# Patient Record
Sex: Female | Born: 1970 | Race: White | Hispanic: No | Marital: Married | Smoking: Current some day smoker
Health system: Southern US, Community
[De-identification: ages and names within clinical notes are randomized; demographics above are authoritative.]

## PROBLEM LIST (undated history)

## (undated) ENCOUNTER — Inpatient Hospital Stay (HOSPITAL_COMMUNITY): Payer: 59

## (undated) DIAGNOSIS — T8859XA Other complications of anesthesia, initial encounter: Secondary | ICD-10-CM

## (undated) DIAGNOSIS — T4145XA Adverse effect of unspecified anesthetic, initial encounter: Secondary | ICD-10-CM

## (undated) DIAGNOSIS — F419 Anxiety disorder, unspecified: Secondary | ICD-10-CM

## (undated) DIAGNOSIS — D649 Anemia, unspecified: Secondary | ICD-10-CM

## (undated) DIAGNOSIS — M1990 Unspecified osteoarthritis, unspecified site: Secondary | ICD-10-CM

## (undated) HISTORY — PX: BREAST BIOPSY: SHX20

---

## 2000-02-13 HISTORY — PX: BACK SURGERY: SHX140

## 2008-02-13 HISTORY — PX: BREAST BIOPSY: SHX20

## 2009-08-22 ENCOUNTER — Ambulatory Visit: Payer: Self-pay | Admitting: General Surgery

## 2011-11-26 ENCOUNTER — Ambulatory Visit: Payer: Self-pay | Admitting: Family Medicine

## 2012-07-28 ENCOUNTER — Emergency Department: Payer: Self-pay | Admitting: Emergency Medicine

## 2012-07-28 LAB — CBC
HGB: 12.4 g/dL (ref 12.0–16.0)
MCH: 30.5 pg (ref 26.0–34.0)
MCHC: 34.2 g/dL (ref 32.0–36.0)
MCV: 89 fL (ref 80–100)
RDW: 13.4 % (ref 11.5–14.5)
WBC: 5.9 10*3/uL (ref 3.6–11.0)

## 2012-07-28 LAB — URINALYSIS, COMPLETE
Bilirubin,UR: NEGATIVE
Glucose,UR: NEGATIVE mg/dL (ref 0–75)
Leukocyte Esterase: NEGATIVE
Nitrite: NEGATIVE
Ph: 6 (ref 4.5–8.0)
Protein: NEGATIVE
RBC,UR: 240 /HPF (ref 0–5)
Squamous Epithelial: <1

## 2013-10-02 ENCOUNTER — Ambulatory Visit: Payer: Self-pay | Admitting: Family Medicine

## 2015-02-13 HISTORY — PX: COLONOSCOPY: SHX174

## 2015-10-20 ENCOUNTER — Other Ambulatory Visit: Payer: Self-pay | Admitting: Obstetrics and Gynecology

## 2015-10-20 DIAGNOSIS — Z1231 Encounter for screening mammogram for malignant neoplasm of breast: Secondary | ICD-10-CM

## 2015-11-11 ENCOUNTER — Ambulatory Visit
Admission: RE | Admit: 2015-11-11 | Discharge: 2015-11-11 | Disposition: A | Discharge status: Home / Self Care | Payer: 59 | Source: Ambulatory Visit | Attending: Obstetrics and Gynecology | Admitting: Obstetrics and Gynecology

## 2015-11-11 ENCOUNTER — Encounter (HOSPITAL_COMMUNITY): Payer: Self-pay

## 2015-11-11 DIAGNOSIS — Z1231 Encounter for screening mammogram for malignant neoplasm of breast: Secondary | ICD-10-CM | POA: Diagnosis present

## 2015-11-23 NOTE — H&P (Signed)
Christina Gibbs is a 45 y.o. female here for Pre Op Consulting (discuss d&c) .  AUB:  - Heavy menstrual bleeding, with large clots, and bleeding mid-cycle  Workup has included: TVUS 10/31/15 Ut wnl Endometrium- possible polyp=0.98 cm  Lt ov wnl Rt simple ov cyst=2.10 cm  Free fluid pcds  Pt is moving to [place] soon and would like to skip the SIS, EMBx and move straight to [facility].  Has a hx of back surgery and had problems with anesthesia - trouble going to sleep with anesthesia. Is very anxious prior to this surgery.  Allergies to morphine +anxiety- will give BZD for day of procedure.  Past Medical History:  has a past medical history of Anemia; Anxiety; Chicken pox; Lumbar disc herniation; Measles; and Stomach ulcer.  Past Surgical History:  has a past surgical history that includes Lumbar discectomy and right breast benign lesion (2012). Family History: family history includes Breast cancer (age of onset: 100) in her maternal aunt; Breast cancer (age of onset: 47) in her maternal grandmother; Colon cancer (age of onset: 26) in her father and paternal grandfather; Coronary artery disease in her paternal grandmother; Diabetes type II in her maternal aunt, maternal aunt, maternal grandmother, maternal uncle, maternal uncle, and mother; Heart attack in her paternal uncle; Heart disease in her paternal grandmother and paternal uncle; Hypertension in her maternal aunt, maternal grandmother, maternal uncle, and mother; Ovarian cancer (age of onset: 74) in her mother; Skin cancer in her father. Social History:  reports that she has been smoking Cigarettes.  She has been smoking about 0.30 packs per day. She has never used smokeless tobacco. She reports that she drinks alcohol. She reports that she does not use illicit drugs. OB/GYN History:  OB History    Gravida Para Term Preterm AB Living   1 1 1  0 0 1   SAB TAB Ectopic Multiple Live Births   0 0 0 0 1      Allergies: is allergic  to morphine and sulfa (sulfonamide antibiotics). Medications:  Current Outpatient Prescriptions:  .  busPIRone (BUSPAR) 15 MG tablet, Take 1 tablet (15 mg total) by mouth 2 (two) times daily., Disp: 60 tablet, Rfl: 11 .  carisoprodol (SOMA) 350 MG tablet, Take 350 mg by mouth., Disp: , Rfl:  .  clonazePAM (KLONOPIN) 0.5 MG tablet, Take 0.5 mg by mouth., Disp: , Rfl:  .  clonazePAM (KLONOPIN) 1 MG tablet, Take 1 tablet (1 mg total) by mouth 2 (two) times daily as needed for Anxiety., Disp: 2 tablet, Rfl: 0   Review of Systems: No SOB, no palpitations or chest pain, no new lower extremity edema, no nausea or vomiting or bowel or bladder complaints. See HPI for gyn specific ROS.     Exam:      Vitals:   11/14/15 1412  BP: (P) 126/72  Pulse: (P) 87   Body mass index is 23.93 kg/(m^2) (pended).  WDWN white  female in NAD Lungs: CTA  CV : RRR without murmur   Breast: deferred Neck:  no thyromegaly Abdomen: soft , no mass, normal active bowel sounds,  non-tender, no rebound tenderness Skin: No rashes, ulcers or skin lesions noted. No excessive hirsutism or acne noted.  Neurological: Appears alert and oriented and is a good historian. No gross abnormalities are noted. Psychological: Normal affect and mood. No signs of anxiety or depression noted.   Pelvic:  Deferred    Impression:   The primary encounter diagnosis was Abnormal uterine bleeding (AUB),  unspecified. A diagnosis of Anticipatory anxiety was also pertinent to this visit.    Plan:   - -  Preoperative visit: D&C hysteroscopy, polypectomy with Myosure device. Consents signed today. Risks of surgery were discussed with the patient including but not limited to: bleeding which may require transfusion; infection which may require antibiotics; injury to uterus or surrounding organs; intrauterine scarring which may impair future fertility; need for additional procedures including laparotomy or laparoscopy;  and other postoperative/anesthesia complications. Written informed consent was obtained.  This is a scheduled same-day surgery. She will have a postop visit in 2 weeks to review operative findings and pathology.  Anxiety: BZD prior to surgery

## 2015-11-28 ENCOUNTER — Encounter
Admission: RE | Admit: 2015-11-28 | Discharge: 2015-11-28 | Disposition: A | Discharge status: Home / Self Care | Payer: 59 | Source: Ambulatory Visit | Attending: Obstetrics and Gynecology | Admitting: Obstetrics and Gynecology

## 2015-11-28 HISTORY — DX: Adverse effect of unspecified anesthetic, initial encounter: T41.45XA

## 2015-11-28 HISTORY — DX: Anemia, unspecified: D64.9

## 2015-11-28 HISTORY — DX: Other complications of anesthesia, initial encounter: T88.59XA

## 2015-11-28 NOTE — Patient Instructions (Signed)
  Your procedure is scheduled on: 12-02-15 (FRIDAY) Report to Same Day Surgery 2nd floor medical mall To find out your arrival time please call [phone] between 1PM - 3PM on 12-01-15 [facility](THURSDAY)  Remember: Instructions that are not followed completely may result in serious medical risk, up to and including death, or upon the discretion of your surgeon and anesthesiologist your surgery may need to be rescheduled.    _x___ 1. Do not eat food or drink liquids after midnight. No gum chewing or hard candies.     __x__ 2. No Alcohol for 24 hours before or after surgery.   __x__3. No Smoking for 24 prior to surgery.   ____  4. Bring all medications with you on the day of surgery if instructed.    __x__ 5. Notify your doctor if there is any change in your medical condition     (cold, fever, infections).     Do not wear jewelry, make-up, hairpins, clips or nail polish.  Do not wear lotions, powders, or perfumes. You may wear deodorant.  Do not shave 48 hours prior to surgery. Men may shave face and neck.  Do not bring valuables to the hospital.    [facility] any belongings or valuables.               Contacts, dentures or bridgework may not be worn into surgery.  Leave your suitcase in the car. After surgery it may be brought to your room.  For patients admitted to the hospital, discharge time is determined by your treatment team.   Patients discharged the day of surgery will not be allowed to drive home.    Please read over the following fact sheets that you were given:   [facility] Pre[facility] for Surgery and or MRSA Information   _x___ Take these medicines the morning of surgery with A SIP OF WATER:    1. KLONOPIN (CLONAZEPAM)  2. BUSPAR  3.  4.  5.  6.  ____Fleets enema or Magnesium Citrate as directed.   ____ Use CHG Soap or sage wipes as directed on instruction sheet   ____ Use inhalers on the day of surgery and bring to hospital day of  surgery  ____ Stop metformin 2 days prior to surgery    ____ Take 1/2 of usual insulin dose the night before surgery and none on the morning of surgery.   ____ Stop aspirin or coumadin, or plavix  x__ Stop Anti-inflammatories such as Advil, Aleve, Ibuprofen, Motrin, Naproxen,          Naprosyn, Goodies powders or aspirin products. Ok to take Tylenol.   _X___ Stop supplements until after surgery-STOP PROBIOTIC AND CRANBERRY-VIT C NOW  ____ Bring C-Pap to the hospital.

## 2015-11-30 ENCOUNTER — Encounter
Admission: RE | Admit: 2015-11-30 | Discharge: 2015-11-30 | Disposition: A | Discharge status: Home / Self Care | Payer: 59 | Source: Ambulatory Visit | Attending: Obstetrics and Gynecology | Admitting: Obstetrics and Gynecology

## 2015-11-30 DIAGNOSIS — F419 Anxiety disorder, unspecified: Secondary | ICD-10-CM | POA: Diagnosis not present

## 2015-11-30 DIAGNOSIS — F17210 Nicotine dependence, cigarettes, uncomplicated: Secondary | ICD-10-CM | POA: Diagnosis not present

## 2015-11-30 DIAGNOSIS — N939 Abnormal uterine and vaginal bleeding, unspecified: Secondary | ICD-10-CM | POA: Diagnosis present

## 2015-11-30 DIAGNOSIS — Z8041 Family history of malignant neoplasm of ovary: Secondary | ICD-10-CM | POA: Diagnosis not present

## 2015-11-30 DIAGNOSIS — Z885 Allergy status to narcotic agent status: Secondary | ICD-10-CM | POA: Diagnosis not present

## 2015-11-30 DIAGNOSIS — Z79899 Other long term (current) drug therapy: Secondary | ICD-10-CM | POA: Diagnosis not present

## 2015-11-30 DIAGNOSIS — N840 Polyp of corpus uteri: Secondary | ICD-10-CM | POA: Diagnosis not present

## 2015-11-30 LAB — BASIC METABOLIC PANEL
Anion gap: 7 (ref 5–15)
BUN: 18 mg/dL (ref 6–20)
CALCIUM: 8.9 mg/dL (ref 8.9–10.3)
CO2: 23 mmol/L (ref 22–32)
CREATININE: 0.78 mg/dL (ref 0.44–1.00)
Chloride: 106 mmol/L (ref 101–111)
GFR calc non Af Amer: >60 mL/min (ref >60)
Glucose, Bld: 91 mg/dL (ref 65–99)
Potassium: 3.4 mmol/L — ABNORMAL LOW (ref 3.5–5.1)
Sodium: 136 mmol/L (ref 135–145)

## 2015-11-30 LAB — CBC
HCT: 39.6 % (ref 35.0–47.0)
Hemoglobin: 13.2 g/dL (ref 12.0–16.0)
MCH: 29 pg (ref 26.0–34.0)
MCHC: 33.3 g/dL (ref 32.0–36.0)
MCV: 87 fL (ref 80.0–100.0)
PLATELETS: 229 10*3/uL (ref 150–440)
RBC: 4.55 MIL/uL (ref 3.80–5.20)
RDW: 14.1 % (ref 11.5–14.5)
WBC: 5.2 10*3/uL (ref 3.6–11.0)

## 2015-11-30 LAB — TYPE AND SCREEN
ABO/RH(D): A POS
Antibody Screen: NEGATIVE

## 2015-12-02 ENCOUNTER — Ambulatory Visit: Payer: 59 | Admitting: Anesthesiology

## 2015-12-02 ENCOUNTER — Ambulatory Visit
Admission: RE | Admit: 2015-12-02 | Discharge: 2015-12-02 | Disposition: A | Discharge status: Home / Self Care | Payer: 59 | Source: Ambulatory Visit | Attending: Obstetrics and Gynecology | Admitting: Obstetrics and Gynecology

## 2015-12-02 ENCOUNTER — Encounter
Admission: RE | Disposition: A | Discharge status: Home / Self Care | Payer: Self-pay | Source: Ambulatory Visit | Attending: Obstetrics and Gynecology

## 2015-12-02 DIAGNOSIS — Z79899 Other long term (current) drug therapy: Secondary | ICD-10-CM | POA: Insufficient documentation

## 2015-12-02 DIAGNOSIS — N939 Abnormal uterine and vaginal bleeding, unspecified: Secondary | ICD-10-CM | POA: Insufficient documentation

## 2015-12-02 DIAGNOSIS — Z885 Allergy status to narcotic agent status: Secondary | ICD-10-CM | POA: Insufficient documentation

## 2015-12-02 DIAGNOSIS — N840 Polyp of corpus uteri: Secondary | ICD-10-CM | POA: Diagnosis not present

## 2015-12-02 DIAGNOSIS — F17210 Nicotine dependence, cigarettes, uncomplicated: Secondary | ICD-10-CM | POA: Insufficient documentation

## 2015-12-02 DIAGNOSIS — F419 Anxiety disorder, unspecified: Secondary | ICD-10-CM | POA: Insufficient documentation

## 2015-12-02 DIAGNOSIS — Z8041 Family history of malignant neoplasm of ovary: Secondary | ICD-10-CM | POA: Insufficient documentation

## 2015-12-02 HISTORY — PX: DILATATION & CURETTAGE/HYSTEROSCOPY WITH MYOSURE: SHX6511

## 2015-12-02 LAB — POCT PREGNANCY, URINE: Preg Test, Ur: NEGATIVE

## 2015-12-02 SURGERY — DILATATION & CURETTAGE/HYSTEROSCOPY WITH MYOSURE
Anesthesia: General

## 2015-12-02 MED ORDER — FAMOTIDINE 20 MG PO TABS
20.0000 mg | ORAL_TABLET | Freq: Once | ORAL | Status: AC
  Administered 2015-12-02: 20 mg via ORAL

## 2015-12-02 MED ORDER — LIDOCAINE HCL (CARDIAC) 20 MG/ML IV SOLN
INTRAVENOUS | Status: DC | PRN
  Administered 2015-12-02: 60 mg via INTRAVENOUS

## 2015-12-02 MED ORDER — DOCUSATE SODIUM 100 MG PO CAPS: 100.0000 mg | ORAL_CAPSULE | Freq: Every day | ORAL | 3 refills | Status: DC | PRN

## 2015-12-02 MED ORDER — FAMOTIDINE 20 MG PO TABS
ORAL_TABLET | ORAL | Status: AC
  Administered 2015-12-02: 20 mg via ORAL
  Filled 2015-12-02: qty 1

## 2015-12-02 MED ORDER — PROPOFOL 10 MG/ML IV BOLUS
INTRAVENOUS | Status: DC | PRN
  Administered 2015-12-02: 120 mg via INTRAVENOUS

## 2015-12-02 MED ORDER — FENTANYL CITRATE (PF) 100 MCG/2ML IJ SOLN: 25.0000 ug | INTRAMUSCULAR | Status: DC | PRN

## 2015-12-02 MED ORDER — ONDANSETRON 4 MG PO TBDP: 4.0000 mg | ORAL_TABLET | Freq: Three times a day (TID) | ORAL | 0 refills | Status: DC | PRN

## 2015-12-02 MED ORDER — KETOROLAC TROMETHAMINE 30 MG/ML IJ SOLN
INTRAMUSCULAR | Status: DC | PRN
  Administered 2015-12-02: 15 mg via INTRAVENOUS

## 2015-12-02 MED ORDER — IBUPROFEN 800 MG PO TABS: 800.0000 mg | ORAL_TABLET | Freq: Three times a day (TID) | ORAL | 0 refills | Status: AC | PRN

## 2015-12-02 MED ORDER — ONDANSETRON HCL 4 MG/2ML IJ SOLN: 4.0000 mg | Freq: Once | INTRAMUSCULAR | Status: DC | PRN

## 2015-12-02 MED ORDER — DEXAMETHASONE SODIUM PHOSPHATE 10 MG/ML IJ SOLN
INTRAMUSCULAR | Status: DC | PRN
  Administered 2015-12-02: 4 mg via INTRAVENOUS

## 2015-12-02 MED ORDER — LACTATED RINGERS IV SOLN
INTRAVENOUS | Status: DC
  Administered 2015-12-02: 09:00:00 via INTRAVENOUS

## 2015-12-02 MED ORDER — ONDANSETRON HCL 4 MG/2ML IJ SOLN
INTRAMUSCULAR | Status: DC | PRN
  Administered 2015-12-02: 4 mg via INTRAVENOUS

## 2015-12-02 MED ORDER — MIDAZOLAM HCL 5 MG/5ML IJ SOLN
INTRAMUSCULAR | Status: DC | PRN
  Administered 2015-12-02: 2 mg via INTRAVENOUS

## 2015-12-02 MED ORDER — FENTANYL CITRATE (PF) 100 MCG/2ML IJ SOLN
INTRAMUSCULAR | Status: DC | PRN
  Administered 2015-12-02 (×4): 25 ug via INTRAVENOUS

## 2015-12-02 MED ORDER — LACTATED RINGERS IV SOLN
INTRAVENOUS | Status: DC
  Administered 2015-12-02: 12:00:00 via INTRAVENOUS

## 2015-12-02 SURGICAL SUPPLY — 18 items
CANISTER SUC SOCK COL 7IN (MISCELLANEOUS) ×3 IMPLANT
CANISTER SUCT 3000ML (MISCELLANEOUS) ×3 IMPLANT
CATH ROBINSON RED A/P 16FR (CATHETERS) ×3 IMPLANT
DEVICE MYOSURE LITE (MISCELLANEOUS) IMPLANT
GLOVE BIO SURGEON STRL SZ 6.5 (GLOVE) ×6 IMPLANT
GLOVE BIO SURGEONS STRL SZ 6.5 (GLOVE) ×3
GLOVE INDICATOR 7.0 STRL GRN (GLOVE) ×9 IMPLANT
GOWN STRL REUS W/ TWL LRG LVL3 (GOWN DISPOSABLE) ×2 IMPLANT
GOWN STRL REUS W/TWL LRG LVL3 (GOWN DISPOSABLE) ×4
KIT RM TURNOVER CYSTO AR (KITS) ×3 IMPLANT
PACK DNC HYST (MISCELLANEOUS) ×3 IMPLANT
PAD OB MATERNITY 4.3X12.25 (PERSONAL CARE ITEMS) ×3 IMPLANT
PAD PREP 24X41 OB/GYN DISP (PERSONAL CARE ITEMS) ×3 IMPLANT
SOL .9 NS 3000ML IRR  AL (IV SOLUTION) ×2
SOL .9 NS 3000ML IRR UROMATIC (IV SOLUTION) ×1 IMPLANT
TUBING CONNECTING 10 (TUBING) ×2 IMPLANT
TUBING CONNECTING 10' (TUBING) ×1
TUBING HYSTEROSCOPY DOLPHIN (MISCELLANEOUS) IMPLANT

## 2015-12-02 NOTE — Anesthesia Procedure Notes (Signed)
Procedure Name: LMA Insertion Date/Time: 12/02/2015 12:15 PM Performed by: Lynnae JanuaryEASTERLING, Braiden Presutti LYNNE Pre-anesthesia Checklist: Patient identified, Emergency Drugs available, Suction available, Patient being monitored and Timeout performed Patient Re-evaluated:Patient Re-evaluated prior to inductionOxygen Delivery Method: Circle system utilized Preoxygenation: Pre-oxygenation with 100% oxygen Intubation Type: IV induction Ventilation: Mask ventilation without difficulty LMA: LMA inserted LMA Size: 4.0 Number of attempts: 1 Dental Injury: Teeth and Oropharynx as per pre-operative assessment

## 2015-12-02 NOTE — Transfer of Care (Signed)
Immediate Anesthesia Transfer of Care Note  Patient: Christina SpragueMeredith L Gibbs  Procedure(s) Performed: Procedure(s): DILATATION & CURETTAGE/HYSTEROSCOPY WITH MYOSURE (N/A)  Patient Location: PACU  Anesthesia Type:General  Level of Consciousness: sedated  Airway & Oxygen Therapy: Patient Spontanous Breathing and Patient connected to nasal cannula oxygen  Post-op Assessment: Report given to RN, Post -op Vital signs reviewed and stable and Patient moving all extremities X 4  Post vital signs: Reviewed and stable  Last Vitals:  Vitals:   12/02/15 0833 12/02/15 1300  BP: 121/88 (P) 104/69  Pulse: 82   Resp: 14   Temp: 36.9 C (P) 36.6 C    Last Pain:  Vitals:   12/02/15 0833  TempSrc: Tympanic         Complications: No apparent anesthesia complications

## 2015-12-02 NOTE — Anesthesia Postprocedure Evaluation (Signed)
Anesthesia Post Note  Patient: Christina SpragueMeredith L Gibbs  Procedure(s) Performed: Procedure(s) (LRB): DILATATION & CURETTAGE/HYSTEROSCOPY WITH MYOSURE (N/A)  Patient location during evaluation: PACU Anesthesia Type: General Level of consciousness: awake and alert Pain management: pain level controlled Vital Signs Assessment: post-procedure vital signs reviewed and stable Respiratory status: spontaneous breathing, nonlabored ventilation, respiratory function stable and patient connected to nasal cannula oxygen Cardiovascular status: blood pressure returned to baseline and stable Postop Assessment: no signs of nausea or vomiting Anesthetic complications: no    Last Vitals:  Vitals:   12/02/15 1345 12/02/15 1401  BP: 113/79 116/61  Pulse: 78 65  Resp:  14  Temp: 36.7 C 36.8 C    Last Pain:  Vitals:   12/02/15 1401  TempSrc: Oral  PainSc: 1                  Melayah Skorupski S

## 2015-12-02 NOTE — Discharge Instructions (Signed)

## 2015-12-02 NOTE — Anesthesia Preprocedure Evaluation (Addendum)
Anesthesia Evaluation  Patient identified by MRN, date of birth, ID band Patient awake    Reviewed: Allergy & Precautions, NPO status , Patient's Chart, lab work & pertinent test results, reviewed documented beta blocker date and time   Airway Mallampati: II  TM Distance: >3 FB     Dental  (+) Chipped   Pulmonary Current Smoker,           Cardiovascular      Neuro/Psych    GI/Hepatic   Endo/Other    Renal/GU      Musculoskeletal   Abdominal   Peds  Hematology  (+) anemia ,   Anesthesia Other Findings Overbite. Can take antibiotics other than morphine as far as she knows. During her lumbar Laminectomy she had a lot of shaking.  Reproductive/Obstetrics                            Anesthesia Physical Anesthesia Plan  ASA: II  Anesthesia Plan: General   Post-op Pain Management:    Induction: Intravenous  Airway Management Planned: LMA  Additional Equipment:   Intra-op Plan:   Post-operative Plan:   Informed Consent: I have reviewed the patients History and Physical, chart, labs and discussed the procedure including the risks, benefits and alternatives for the proposed anesthesia with the patient or authorized representative who has indicated his/her understanding and acceptance.     Plan Discussed with: CRNA  Anesthesia Plan Comments:         Anesthesia Quick Evaluation

## 2015-12-02 NOTE — Interval H&P Note (Signed)
History and Physical Interval Note:  12/02/2015 11:48 AM  Christina SpragueMeredith L Kirshenbaum  has presented today for surgery, with the diagnosis of AUB  The various methods of treatment have been discussed with the patient and family. After consideration of risks, benefits and other options for treatment, the patient has consented to  Procedure(s): DILATATION & CURETTAGE/HYSTEROSCOPY WITH MYOSURE (N/A) as a surgical intervention .  [name] history has been reviewed, patient examined, no change in status, stable for surgery.  I have reviewed the patient's chart and labs.  Questions were answered to the patient's satisfaction.     Christeen DouglasBEASLEY, Keshana Klemz

## 2015-12-02 NOTE — OR Nursing (Signed)
Instructed on the use of incentive spirometry with return demonstration.

## 2015-12-02 NOTE — Op Note (Signed)
Operative Report Hysteroscopy with Dilation and Curettage   Indications: Abnormal uterine bleeding   Pre-operative Diagnosis: Endometrial polyp   Post-operative Diagnosis: same.  Procedure: 1. Exam under anesthesia 2. Fractional D&C 3. Hysteroscopy 4. Polypectomy  Surgeon: Christeen DouglasBethany Neveen Daponte, MD  Assistant(s):  None  Anesthesia: General endotracheal anesthesia  Anesthesiologist: No responsible provider has been recorded for the case. Anesthesiologist: Berdine AddisonMathai Thomas, MD CRNA: Lynnae JanuaryJennifer Lynne Easterling, CRNA; Lily KocherNicole Peralta, CRNA  Estimated Blood Loss:  20mL         Intraoperative medications: Toradol, IV tylenol         Total IV Fluids: 700ml  Urine Output: 25ml  Total Fluid Deficit:  n/a          Specimens: Endocervical curettings, endometrial curettings, endometrial polyp         Complications:  None; patient tolerated the procedure well.         Disposition: PACU - hemodynamically stable.         Condition: stable  Findings: Uterus measuring 10 cm by sound; normal cervix, vagina, perineum. Anterior endometrial polyp at fundus  Indication for procedure/Consents: 45 y.o. F  here for scheduled surgery for the aforementioned diagnoses.   Risks of surgery were discussed with the patient including but not limited to: bleeding which may require transfusion; infection which may require antibiotics; injury to uterus or surrounding organs; intrauterine scarring which may impair future fertility; need for additional procedures including laparotomy or laparoscopy; and other postoperative/anesthesia complications. Written informed consent was obtained.    Procedure Details:   D&C/Polypectomy The patient was taken to the operating room where anesthesia was administered and was found to be adequate. After a formal and adequate timeout was performed, she was placed in the dorsal lithotomy position and examined with the above findings. She was then prepped and draped in the sterile  manner. Her bladder was catheterized for an estimated amount of clear, yellow urine. A weighed speculum was then placed in the patient's vagina and a single tooth tenaculum was applied to the anterior lip of the cervix.  Her cervix was serially dilated to 15 [place]French using Hanks dilators. An ECC was performed. Her uterus was sounded to 10 cm. The hysteroscope was introduced to reveal the above findings. Endometrial polypectomy was performed with polypectomy forceps.   A sharp curettage was then performed until there was a gritty texture in all four quadrants. The tenaculum was removed from the anterior lip of the cervix and the vaginal speculum was removed after applying silver nitrate for good hemostasis.   The patient tolerated the procedure well and was taken to the recovery area awake and in stable condition. She received iv acetaminophen and Toradol prior to leaving the OR.  The patient will be discharged to home as per PACU criteria. Routine postoperative instructions given. She was prescribed Ibuprofen and Colace. She will follow up in the clinic in two weeks for postoperative evaluation.

## 2015-12-05 ENCOUNTER — Encounter: Payer: Self-pay | Admitting: Obstetrics and Gynecology

## 2015-12-05 LAB — SURGICAL PATHOLOGY

## 2016-10-01 ENCOUNTER — Other Ambulatory Visit: Payer: Self-pay | Admitting: Family Medicine

## 2016-10-01 DIAGNOSIS — Z1231 Encounter for screening mammogram for malignant neoplasm of breast: Secondary | ICD-10-CM

## 2016-11-12 ENCOUNTER — Ambulatory Visit
Admission: RE | Admit: 2016-11-12 | Discharge: 2016-11-12 | Disposition: A | Discharge status: Home / Self Care | Payer: 59 | Source: Ambulatory Visit | Attending: Family Medicine | Admitting: Family Medicine

## 2016-11-12 DIAGNOSIS — Z1231 Encounter for screening mammogram for malignant neoplasm of breast: Secondary | ICD-10-CM | POA: Diagnosis present

## 2017-09-10 ENCOUNTER — Ambulatory Visit: Payer: Self-pay | Admitting: Urology

## 2018-02-20 ENCOUNTER — Other Ambulatory Visit: Payer: Self-pay | Admitting: Family Medicine

## 2018-02-20 DIAGNOSIS — Z1231 Encounter for screening mammogram for malignant neoplasm of breast: Secondary | ICD-10-CM

## 2018-03-05 ENCOUNTER — Ambulatory Visit: Payer: 59

## 2018-06-02 ENCOUNTER — Ambulatory Visit: Admit: 2018-06-02 | Payer: 59 | Admitting: Obstetrics and Gynecology

## 2018-06-02 SURGERY — OOPHORECTOMY, LAPAROSCOPIC
Anesthesia: Choice

## 2018-12-13 ENCOUNTER — Ambulatory Visit: Admit: 2018-12-13 | Payer: 59 | Admitting: Obstetrics and Gynecology

## 2018-12-13 SURGERY — DILATATION AND CURETTAGE /HYSTEROSCOPY
Anesthesia: Choice

## 2019-02-25 NOTE — H&P (Signed)
Christina Gibbs is a 49 y.o. female presenting with Pre Op Consulting  History of Present Illness: Patient is an established patient who presents for a pre-operative visit after rescheduling a Diagnostic Laparoscopy with Left Oophorectomy, D&C Hysteroscopy with Possible Polypectomy. Indications are patient's history of chronic pelvic pain, menometrorrhagia, cyst of left ovary, and endometrial polyp. Of note, patient has strong anxiety controlled on medication.  Workup: Body mass index is 24.6 kg/m.  Pap:  03/05/2018 ASCUS/negHPV 09/2015 neg/neg HPV  EMBx: none  D&C10/2017:  Operative findings:anterior polyp at fundus Pathology:neg for malignancy  TVUS:  07/2016- 9 x 5 x 4 cm uterus w/ 8 mm endometrial stripe with possible polyps. Left ovary with a 1 cm simple cyst. Right ovary with a volume of 12cm3. Hyperechoic area cervix =0.54cm  02/2018- 9.4 x 5 x 5.84 cm uterus w/ 9.61 mm endometrial stripe and possible polyp seen 0.71 x 0.51 x 0.84 cm. Rt ov wnl.  Two Lt ov cysts seen; 1 complex septated=1.99 cm Septation=0.16 cm 2 simple collapsed=1.21 cm  Past Medical History:  has a past medical history of Abnormal mammogram (2012), Anemia, Anxiety, ASCUS of cervix with negative high risk HPV, BRCA negative (2014), Chicken pox, Lumbar disc herniation, Measles, Stomach ulcer, and UTI (urinary tract infection).  Past Surgical History:  has a past surgical history that includes Lumbar discectomy; right breast benign lesion (2012); Colonoscopy (01/03/2016); and egd (01/03/2016). Family History: family history includes Breast cancer (age of onset: 98) in her maternal aunt; Breast cancer (age of onset: 85) in her maternal grandmother; Colon cancer in her father and paternal grandfather; Coronary Artery Disease (Blocked arteries around heart) in her paternal grandmother; Diabetes type II in her maternal aunt, maternal aunt, maternal grandmother, maternal  uncle, maternal uncle, and mother; Heart disease in her paternal grandmother and paternal uncle; High blood pressure (Hypertension) in her maternal aunt, maternal grandmother, maternal uncle, and mother; Myocardial Infarction (Heart attack) in her paternal uncle; Ovarian cancer (age of onset: 53) in her mother; Skin cancer in her father; Stroke in her maternal grandmother. Social History:  reports that she has been smoking cigarettes. She has been smoking about 0.30 packs per day. She has never used smokeless tobacco. She reports current alcohol use. She reports that she does not use drugs. OB/GYN History:          OB History    Gravida  1   Para  1   Term  1   Preterm  0   AB  0   Living  1     SAB  0   TAB  0   Ectopic  0   Molar  0   Multiple  0   Live Births  1        Allergies: is allergic to metronidazole; morphine; and sulfa (sulfonamide antibiotics). Medications:  Current Outpatient Medications:  .  ascorbic acid, vitamin C, (VITAMIN C) 500 MG tablet, Take 500 mg by mouth once daily, Disp: , Rfl:  .  carisoprodol (SOMA) 350 MG tablet, Take 1 tablet (350 mg total) by mouth 3 (three) times daily as needed, Disp: 90 tablet, Rfl: 0 .  citalopram (CELEXA) 40 MG tablet, TAKE 1 TABLET BY MOUTH EVERY DAY, Disp: 90 tablet, Rfl: 3 .  clonazePAM (KLONOPIN) 0.5 MG tablet, Take 1 tablet (0.5 mg total) by mouth once daily as needed, Disp: 30 tablet, Rfl: 3 .  CRANBERRY FRUIT EXTRACT (CRANBERRY ORAL), Take by mouth., Disp: , Rfl:  .  Lactobac 41-B.bifid,lactis-FOS (  ULTIMATE PROBIOTIC-10) 111 mg (25 billion cell) Cap, Take by mouth., Disp: , Rfl:  .  meloxicam (MOBIC) 15 MG tablet, Take 1 tablet (15 mg total) by mouth once daily Needs appt for more refills, Disp: 30 tablet, Rfl: 0   Exam:   BP 112/71   Ht 165.1 cm (5' 5")   Wt 67 kg (147 lb 12.8 oz)   LMP 02/08/2019 (Exact Date)   BMI 24.60 kg/m   Constitutional:  General appearance: Well nourished, well developed  female in no acute distress.  Neuro/psych:  Normal mood and affect. No gross motor deficits. Neck:  Supple, normal appearance.  Respiratory:  Normal respiratory effort, no use of accessory muscles Skin:  No visible rashes or external lesions  Impression:   The primary encounter diagnosis was Pre-op evaluation. Diagnoses of Pelvic pain in female, Excessive or frequent menstruation, Endometrial polyp, and Left ovarian cyst were also pertinent to this visit.  Plan:   1.Pelvic Pain, Left Ovarian Cyst, Menorrhagia, Endometrial Polyp Patient returns for a preoperative discussion regarding her plans to proceed with surgical treatment of herchronic pelvic pain, cyst of left ovary, menorrhagia with irregular cycle, endometrial polypby Diagnostic Laparoscopy with Left Oophorectomy and D&C Hysteroscopy with Polypectomyprocedure.The patient and I discussed the technical aspects of the procedure including the potential for risks and complications.These include but are not limited to the risk of infection requiring post-operative antibiotics or further procedures.We talked about the risk of injury to adjacent organs including bladder, bowel, ureter, blood vessels or nerves.We talked about the need to convert to an open incision.We talked about the possible need for blood transfusion. Possibleintrauterine scarring which may impair future fertility.We talked aboutpostop complications such asthromboembolic or cardiopulmonary complications.All of her questions were answered. Her preoperative exam was completed and the appropriate consents were signed. She is scheduled to undergo this procedure in the near future.   Specific Peri-operative Considerations: - Consent: obtained today - Health Maintenance:none - Labs: CBC, CMP preoperatively - Studies: EKG, CXR preoperatively - Bowel Preparation: None required - XNT:ZGYFV 2g - VTE ppx: SCDs perioperatively - Glucose Protocol:none -  Beta-blockade:none

## 2019-02-27 ENCOUNTER — Encounter
Admission: RE | Admit: 2019-02-27 | Discharge: 2019-02-27 | Disposition: A | Discharge status: Home / Self Care | Payer: 59 | Source: Ambulatory Visit | Attending: Obstetrics and Gynecology | Admitting: Obstetrics and Gynecology

## 2019-02-27 DIAGNOSIS — Z01812 Encounter for preprocedural laboratory examination: Secondary | ICD-10-CM | POA: Diagnosis not present

## 2019-02-27 HISTORY — DX: Anxiety disorder, unspecified: F41.9

## 2019-02-27 HISTORY — DX: Unspecified osteoarthritis, unspecified site: M19.90

## 2019-02-27 NOTE — Patient Instructions (Addendum)
Your procedure is scheduled on: 03-04-19 [facility] Report to Same Day Surgery 2nd floor medical mall [facility] Entrance-take elevator on left to 2nd floor.  Check in with surgery information desk.) To find out your arrival time please call [phone] between 1PM - 3PM on 03-03-19 TUESDAY  Remember: Instructions that are not followed completely may result in serious medical risk, up to and including death, or upon the discretion of your surgeon and anesthesiologist your surgery may need to be rescheduled.    _x___ 1. Do not eat food after midnight the night before your procedure. NO GUM OR CANDY AFTER MIDNIGHT. You may drink clear liquids up to 2 hours before you are scheduled to arrive at the hospital for your procedure.  Do not drink clear liquids within 2 hours of your scheduled arrival to the hospital.  Clear liquids include  --Water or Apple juice without pulp  --Gatorade  --Black Coffee or Clear Tea (No milk, no creamers, do not add anything to the coffee or Tea   ____Ensure clear carbohydrate drink on the way to the hospital for bariatric patients  _X___Ensure clear carbohydrate drink 3 hours PRIOR TO ARRIVAL TIME TO HOSPITAL      __x__ 2. No Alcohol for 24 hours before or after surgery.   __x__3. No Smoking or e-cigarettes for 24 prior to surgery.  Do not use any chewable tobacco products for at least 6 hour prior to surgery   ____  4. Bring all medications with you on the day of surgery if instructed.    __x__ 5. Notify your doctor if there is any change in your medical condition     (cold, fever, infections).    x___6. On the morning of surgery brush your teeth with toothpaste and water.  You may rinse your mouth with mouth wash if you wish.  Do not swallow any toothpaste or mouthwash.   Do not wear jewelry, make-up, hairpins, clips or nail polish.  Do not wear lotions, powders, or perfumes.  Do not shave 48 hours prior to surgery. Men may shave face and neck.  Do not  bring valuables to the hospital.    [facility] is not responsible for any belongings or valuables.               Contacts, dentures or bridgework may not be worn into surgery.  Leave your suitcase in the car. After surgery it may be brought to your room.  For patients admitted to the hospital, discharge time is determined by your treatment team.  _  Patients discharged the day of surgery will not be allowed to drive home.  You will need someone to drive you home and stay with you the night of your procedure.    Please read over the following fact sheets that you were given:   [facility] Preparing for Surgery   _x___ TAKE THE FOLLOWING MEDICATION THE MORNING OF SURGERY WITH A SMALL SIP OF WATER. These include:  1. CELEXA (CITALOPRAM)  2. YOU MAY TAKE YOUR KLONOPIN (CLONAZEPAM) THE MORNING OF SURGERY IF NEEDED  3.  4.  5.  6.  ____Fleets enema or Magnesium Citrate as directed.   _x___ Use CHG Soap or sage wipes as directed on instruction sheet   ____ Use inhalers on the day of surgery and bring to hospital day of surgery  ____ Stop Metformin and Janumet 2 days prior to surgery.    ____ Take 1/2 of usual insulin dose the night before surgery  and none on the morning surgery.   ____ Follow recommendations from Cardiologist, Pulmonologist or PCP regarding stopping Aspirin, Coumadin, Plavix ,Eliquis, Effient, or Pradaxa, and Pletal.  X____Stop Anti-inflammatories such as Advil, Aleve, Ibuprofen, Motrin, Naproxen, Naprosyn, Goodies powders or aspirin products  NOW-OK to take Tylenol   _x___ Stop supplements until after surgery-STOP CRANBERRY NOW-MAY RESUME AFTER SURGERY   ____ Bring C-Pap to the hospital.

## 2019-03-02 ENCOUNTER — Other Ambulatory Visit
Admission: RE | Admit: 2019-03-02 | Discharge: 2019-03-02 | Disposition: A | Discharge status: Home / Self Care | Payer: 59 | Source: Ambulatory Visit | Attending: Obstetrics and Gynecology | Admitting: Obstetrics and Gynecology

## 2019-03-02 DIAGNOSIS — Z01812 Encounter for preprocedural laboratory examination: Secondary | ICD-10-CM | POA: Diagnosis not present

## 2019-03-02 DIAGNOSIS — Z20822 Contact with and (suspected) exposure to covid-19: Secondary | ICD-10-CM | POA: Diagnosis not present

## 2019-03-02 LAB — TYPE AND SCREEN
ABO/RH(D): A POS
Antibody Screen: NEGATIVE

## 2019-03-02 LAB — BASIC METABOLIC PANEL
Anion gap: 9 (ref 5–15)
BUN: 17 mg/dL (ref 6–20)
CO2: 26 mmol/L (ref 22–32)
Calcium: 9 mg/dL (ref 8.9–10.3)
Chloride: 102 mmol/L (ref 98–111)
Creatinine, Ser: 0.69 mg/dL (ref 0.44–1.00)
GFR calc Af Amer: >60 mL/min (ref >60)
GFR calc non Af Amer: >60 mL/min (ref >60)
Glucose, Bld: 97 mg/dL (ref 70–99)
Potassium: 3.9 mmol/L (ref 3.5–5.1)
Sodium: 137 mmol/L (ref 135–145)

## 2019-03-02 LAB — CBC
HCT: 39 % (ref 36.0–46.0)
Hemoglobin: 12.8 g/dL (ref 12.0–15.0)
MCH: 30.9 pg (ref 26.0–34.0)
MCHC: 32.8 g/dL (ref 30.0–36.0)
MCV: 94.2 fL (ref 80.0–100.0)
Platelets: 215 10*3/uL (ref 150–400)
RBC: 4.14 MIL/uL (ref 3.87–5.11)
RDW: 12.4 % (ref 11.5–15.5)
WBC: 6 10*3/uL (ref 4.0–10.5)
nRBC: 0 % (ref 0.0–0.2)

## 2019-03-02 LAB — SARS CORONAVIRUS 2 (TAT 6-24 HRS): SARS Coronavirus 2: NEGATIVE

## 2019-03-04 ENCOUNTER — Encounter: Payer: Self-pay | Admitting: Obstetrics and Gynecology

## 2019-03-04 ENCOUNTER — Other Ambulatory Visit: Payer: Self-pay

## 2019-03-04 ENCOUNTER — Encounter
Admission: RE | Disposition: A | Discharge status: Home / Self Care | Payer: Self-pay | Source: Home / Self Care | Attending: Obstetrics and Gynecology

## 2019-03-04 ENCOUNTER — Ambulatory Visit: Payer: 59 | Admitting: Anesthesiology

## 2019-03-04 ENCOUNTER — Ambulatory Visit
Admission: RE | Admit: 2019-03-04 | Discharge: 2019-03-04 | Disposition: A | Discharge status: Home / Self Care | Payer: 59 | Attending: Obstetrics and Gynecology | Admitting: Obstetrics and Gynecology

## 2019-03-04 DIAGNOSIS — K660 Peritoneal adhesions (postprocedural) (postinfection): Secondary | ICD-10-CM | POA: Diagnosis not present

## 2019-03-04 DIAGNOSIS — G8929 Other chronic pain: Secondary | ICD-10-CM | POA: Insufficient documentation

## 2019-03-04 DIAGNOSIS — N921 Excessive and frequent menstruation with irregular cycle: Secondary | ICD-10-CM | POA: Insufficient documentation

## 2019-03-04 DIAGNOSIS — R102 Pelvic and perineal pain: Secondary | ICD-10-CM | POA: Insufficient documentation

## 2019-03-04 DIAGNOSIS — Z8041 Family history of malignant neoplasm of ovary: Secondary | ICD-10-CM | POA: Diagnosis not present

## 2019-03-04 DIAGNOSIS — N840 Polyp of corpus uteri: Secondary | ICD-10-CM | POA: Insufficient documentation

## 2019-03-04 DIAGNOSIS — Z79899 Other long term (current) drug therapy: Secondary | ICD-10-CM | POA: Insufficient documentation

## 2019-03-04 DIAGNOSIS — N8312 Corpus luteum cyst of left ovary: Secondary | ICD-10-CM | POA: Insufficient documentation

## 2019-03-04 DIAGNOSIS — Z791 Long term (current) use of non-steroidal anti-inflammatories (NSAID): Secondary | ICD-10-CM | POA: Insufficient documentation

## 2019-03-04 DIAGNOSIS — F419 Anxiety disorder, unspecified: Secondary | ICD-10-CM | POA: Insufficient documentation

## 2019-03-04 DIAGNOSIS — N83202 Unspecified ovarian cyst, left side: Secondary | ICD-10-CM | POA: Diagnosis present

## 2019-03-04 DIAGNOSIS — F17210 Nicotine dependence, cigarettes, uncomplicated: Secondary | ICD-10-CM | POA: Diagnosis not present

## 2019-03-04 HISTORY — PX: LAPAROSCOPIC UNILATERAL SALPINGO OOPHERECTOMY: SHX5935

## 2019-03-04 HISTORY — PX: HYSTEROSCOPY WITH D & C: SHX1775

## 2019-03-04 HISTORY — PX: LAPAROSCOPY: SHX197

## 2019-03-04 LAB — POCT PREGNANCY, URINE: Preg Test, Ur: NEGATIVE

## 2019-03-04 SURGERY — DILATATION AND CURETTAGE /HYSTEROSCOPY
Anesthesia: General

## 2019-03-04 MED ORDER — ROCURONIUM BROMIDE 100 MG/10ML IV SOLN
INTRAVENOUS | Status: DC | PRN
  Administered 2019-03-04: 35 mg via INTRAVENOUS
  Administered 2019-03-04: 15 mg via INTRAVENOUS

## 2019-03-04 MED ORDER — MIDAZOLAM HCL 2 MG/2ML IJ SOLN
INTRAMUSCULAR | Status: DC | PRN
  Administered 2019-03-04: 2 mg via INTRAVENOUS

## 2019-03-04 MED ORDER — CEFAZOLIN SODIUM-DEXTROSE 2-3 GM-%(50ML) IV SOLR
INTRAVENOUS | Status: DC | PRN
  Administered 2019-03-04: 2 g via INTRAVENOUS

## 2019-03-04 MED ORDER — PROPOFOL 500 MG/50ML IV EMUL
INTRAVENOUS | Status: AC
  Filled 2019-03-04: qty 50

## 2019-03-04 MED ORDER — ONDANSETRON HCL 4 MG/2ML IJ SOLN
INTRAMUSCULAR | Status: DC | PRN
  Administered 2019-03-04: 4 mg via INTRAVENOUS

## 2019-03-04 MED ORDER — OXYCODONE HCL 5 MG PO CAPS: 5.0000 mg | ORAL_CAPSULE | Freq: Four times a day (QID) | ORAL | 0 refills | Status: AC | PRN

## 2019-03-04 MED ORDER — ACETAMINOPHEN 500 MG PO TABS
ORAL_TABLET | ORAL | Status: AC
  Administered 2019-03-04: 1000 mg via ORAL
  Filled 2019-03-04: qty 2

## 2019-03-04 MED ORDER — IBUPROFEN 800 MG PO TABS: 800.0000 mg | ORAL_TABLET | Freq: Three times a day (TID) | ORAL | 1 refills | Status: AC

## 2019-03-04 MED ORDER — ACETAMINOPHEN 500 MG PO TABS: 1000.0000 mg | ORAL_TABLET | Freq: Four times a day (QID) | ORAL | 0 refills | Status: AC

## 2019-03-04 MED ORDER — SUGAMMADEX SODIUM 500 MG/5ML IV SOLN
INTRAVENOUS | Status: DC | PRN
  Administered 2019-03-04: 200 mg via INTRAVENOUS

## 2019-03-04 MED ORDER — ONDANSETRON 4 MG PO TBDP: 4.0000 mg | ORAL_TABLET | Freq: Four times a day (QID) | ORAL | 0 refills | Status: AC | PRN

## 2019-03-04 MED ORDER — FAMOTIDINE 20 MG PO TABS: 20.0000 mg | ORAL_TABLET | Freq: Once | ORAL | Status: AC

## 2019-03-04 MED ORDER — FENTANYL CITRATE (PF) 100 MCG/2ML IJ SOLN
25.0000 ug | INTRAMUSCULAR | Status: DC | PRN
  Administered 2019-03-04: 25 ug via INTRAVENOUS

## 2019-03-04 MED ORDER — LACTATED RINGERS IV SOLN: INTRAVENOUS | Status: DC

## 2019-03-04 MED ORDER — KETOROLAC TROMETHAMINE 30 MG/ML IJ SOLN
INTRAMUSCULAR | Status: DC | PRN
  Administered 2019-03-04: 30 mg via INTRAVENOUS

## 2019-03-04 MED ORDER — DOCUSATE SODIUM 100 MG PO CAPS: 100.0000 mg | ORAL_CAPSULE | Freq: Two times a day (BID) | ORAL | 0 refills | Status: AC

## 2019-03-04 MED ORDER — LIDOCAINE HCL (CARDIAC) PF 100 MG/5ML IV SOSY
PREFILLED_SYRINGE | INTRAVENOUS | Status: DC | PRN
  Administered 2019-03-04: 100 mg via INTRAVENOUS

## 2019-03-04 MED ORDER — ONDANSETRON HCL 4 MG/2ML IJ SOLN: 4.0000 mg | Freq: Once | INTRAMUSCULAR | Status: DC | PRN

## 2019-03-04 MED ORDER — ACETAMINOPHEN 500 MG PO TABS: 1000.0000 mg | ORAL_TABLET | ORAL | Status: AC

## 2019-03-04 MED ORDER — FENTANYL CITRATE (PF) 100 MCG/2ML IJ SOLN
INTRAMUSCULAR | Status: AC
  Filled 2019-03-04: qty 2

## 2019-03-04 MED ORDER — GABAPENTIN 300 MG PO CAPS: 300.0000 mg | ORAL_CAPSULE | ORAL | Status: AC

## 2019-03-04 MED ORDER — GABAPENTIN 300 MG PO CAPS
ORAL_CAPSULE | ORAL | Status: AC
  Administered 2019-03-04: 300 mg via ORAL
  Filled 2019-03-04: qty 1

## 2019-03-04 MED ORDER — LACTATED RINGERS IV SOLN: INTRAVENOUS | Status: DC | PRN

## 2019-03-04 MED ORDER — FENTANYL CITRATE (PF) 100 MCG/2ML IJ SOLN
INTRAMUSCULAR | Status: AC
  Administered 2019-03-04: 25 ug via INTRAVENOUS
  Filled 2019-03-04: qty 2

## 2019-03-04 MED ORDER — FAMOTIDINE 20 MG PO TABS
ORAL_TABLET | ORAL | Status: AC
  Administered 2019-03-04: 20 mg via ORAL
  Filled 2019-03-04: qty 1

## 2019-03-04 MED ORDER — PROPOFOL 10 MG/ML IV BOLUS
INTRAVENOUS | Status: DC | PRN
  Administered 2019-03-04: 150 mg via INTRAVENOUS

## 2019-03-04 MED ORDER — GABAPENTIN 800 MG PO TABS: 800.0000 mg | ORAL_TABLET | Freq: Every day | ORAL | 0 refills | Status: AC

## 2019-03-04 MED ORDER — BUPIVACAINE HCL (PF) 0.5 % IJ SOLN
INTRAMUSCULAR | Status: AC
  Filled 2019-03-04: qty 30

## 2019-03-04 MED ORDER — DEXAMETHASONE SODIUM PHOSPHATE 10 MG/ML IJ SOLN
INTRAMUSCULAR | Status: DC | PRN
  Administered 2019-03-04: 10 mg via INTRAVENOUS

## 2019-03-04 MED ORDER — BUPIVACAINE HCL 0.5 % IJ SOLN
INTRAMUSCULAR | Status: DC | PRN
  Administered 2019-03-04: 10 mL

## 2019-03-04 MED ORDER — FENTANYL CITRATE (PF) 100 MCG/2ML IJ SOLN
INTRAMUSCULAR | Status: DC | PRN
  Administered 2019-03-04: 25 ug via INTRAVENOUS
  Administered 2019-03-04: 50 ug via INTRAVENOUS
  Administered 2019-03-04: 25 ug via INTRAVENOUS

## 2019-03-04 MED ORDER — MIDAZOLAM HCL 2 MG/2ML IJ SOLN
INTRAMUSCULAR | Status: AC
  Filled 2019-03-04: qty 2

## 2019-03-04 SURGICAL SUPPLY — 62 items
ANCHOR TIS RET SYS 235ML (MISCELLANEOUS) ×2 IMPLANT
BAG INFUSER PRESSURE 100CC (MISCELLANEOUS) ×2 IMPLANT
BAG URINE DRAIN 2000ML AR STRL (UROLOGICAL SUPPLIES) ×4 IMPLANT
BLADE SURG SZ11 CARB STEEL (BLADE) ×4 IMPLANT
CANISTER SUCT 3000ML PPV (MISCELLANEOUS) ×4 IMPLANT
CATH FOLEY 2WAY  5CC 16FR (CATHETERS) ×2
CATH ROBINSON RED A/P 16FR (CATHETERS) ×4 IMPLANT
CATH URTH 16FR FL 2W BLN LF (CATHETERS) ×2 IMPLANT
CHLORAPREP W/TINT 26 (MISCELLANEOUS) ×4 IMPLANT
CLOSURE WOUND 1/4X4 (GAUZE/BANDAGES/DRESSINGS) ×1
CORD MONOPOLAR M/FML 12FT (MISCELLANEOUS) ×2 IMPLANT
COVER LIGHT HANDLE STERIS (MISCELLANEOUS) ×2 IMPLANT
COVER WAND RF STERILE (DRAPES) ×4 IMPLANT
DERMABOND ADVANCED (GAUZE/BANDAGES/DRESSINGS) ×2
DERMABOND ADVANCED .7 DNX12 (GAUZE/BANDAGES/DRESSINGS) ×2 IMPLANT
DRAPE GENERAL ENDO 106X123.5 (DRAPES) ×4 IMPLANT
DRAPE LEGGINS SURG 28X43 STRL (DRAPES) ×4 IMPLANT
DRAPE STERI POUCH LG 24X46 STR (DRAPES) IMPLANT
DRAPE UNDER BUTTOCK W/FLU (DRAPES) ×4 IMPLANT
ELECT REM PT RETURN 9FT ADLT (ELECTROSURGICAL) ×4
ELECTRODE REM PT RTRN 9FT ADLT (ELECTROSURGICAL) ×2 IMPLANT
GLOVE BIO SURGEON STRL SZ7 (GLOVE) ×12 IMPLANT
GLOVE INDICATOR 7.5 STRL GRN (GLOVE) ×18 IMPLANT
GOWN STRL REUS W/ TWL LRG LVL3 (GOWN DISPOSABLE) ×4 IMPLANT
GOWN STRL REUS W/TWL LRG LVL3 (GOWN DISPOSABLE) ×4
IRRIGATION STRYKERFLOW (MISCELLANEOUS) ×2 IMPLANT
IRRIGATOR STRYKERFLOW (MISCELLANEOUS)
IV NS 1000ML (IV SOLUTION) ×2
IV NS 1000ML BAXH (IV SOLUTION) ×2 IMPLANT
KIT PINK PAD W/HEAD ARE REST (MISCELLANEOUS) ×4
KIT PINK PAD W/HEAD ARM REST (MISCELLANEOUS) ×2 IMPLANT
KIT PROCEDURE FLUENT (KITS) ×4 IMPLANT
KIT TURNOVER CYSTO (KITS) ×4 IMPLANT
LABEL OR SOLS (LABEL) ×4 IMPLANT
LIGASURE VESSEL 5MM BLUNT TIP (ELECTROSURGICAL) ×2 IMPLANT
NDL FILTER BLUNT 18X1 1/2 (NEEDLE) ×2 IMPLANT
NEEDLE FILTER BLUNT 18X 1/2SAF (NEEDLE) ×2
NEEDLE FILTER BLUNT 18X1 1/2 (NEEDLE) ×2 IMPLANT
NS IRRIG 500ML POUR BTL (IV SOLUTION) ×4 IMPLANT
PACK DNC HYST (MISCELLANEOUS) ×4 IMPLANT
PACK GYN LAPAROSCOPIC (MISCELLANEOUS) ×4 IMPLANT
PAD OB MATERNITY 4.3X12.25 (PERSONAL CARE ITEMS) ×4 IMPLANT
PAD PREP 24X41 OB/GYN DISP (PERSONAL CARE ITEMS) ×4 IMPLANT
POUCH SPECIMEN RETRIEVAL 10MM (ENDOMECHANICALS) IMPLANT
SCISSORS METZENBAUM CVD 33 (INSTRUMENTS) ×2 IMPLANT
SEAL ROD LENS SCOPE MYOSURE (ABLATOR) ×2 IMPLANT
SET TUBE SMOKE EVAC HIGH FLOW (TUBING) ×4 IMPLANT
SLEEVE ENDOPATH XCEL 5M (ENDOMECHANICALS) ×6 IMPLANT
SOL .9 NS 3000ML IRR  AL (IV SOLUTION) ×2
SOL .9 NS 3000ML IRR UROMATIC (IV SOLUTION) ×2 IMPLANT
STRIP CLOSURE SKIN 1/4X4 (GAUZE/BANDAGES/DRESSINGS) ×3 IMPLANT
SUT MNCRL AB 4-0 PS2 18 (SUTURE) ×4 IMPLANT
SUT VIC AB 0 CT1 36 (SUTURE) ×2 IMPLANT
SUT VIC AB 2-0 UR6 27 (SUTURE) ×2 IMPLANT
SUT VIC AB 4-0 SH 27 (SUTURE)
SUT VIC AB 4-0 SH 27XANBCTRL (SUTURE) ×2 IMPLANT
SYR 50ML LL SCALE MARK (SYRINGE) IMPLANT
SYR 5ML LL (SYRINGE) ×4 IMPLANT
TROCAR XCEL NON-BLD 5MMX100MML (ENDOMECHANICALS) ×4 IMPLANT
TUBING ART PRESS 48 MALE/FEM (TUBING) IMPLANT
TUBING CONNECTING 10 (TUBING) ×3 IMPLANT
TUBING CONNECTING 10' (TUBING) ×1

## 2019-03-04 NOTE — Anesthesia Preprocedure Evaluation (Signed)
Anesthesia Evaluation  Patient identified by MRN, date of birth, ID band Patient awake    Reviewed: Allergy & Precautions, H&P , NPO status , Patient's Chart, lab work & pertinent test results, reviewed documented beta blocker date and time   History of Anesthesia Complications (+) history of anesthetic complications  Airway Mallampati: II  TM Distance: >3 FB Neck ROM: full    Dental  (+) Teeth Intact   Pulmonary neg pulmonary ROS, Current Smoker and Patient abstained from smoking.,    Pulmonary exam normal        Cardiovascular negative cardio ROS Normal cardiovascular exam Rhythm:regular Rate:Normal     Neuro/Psych Anxiety negative neurological ROS  negative psych ROS   GI/Hepatic negative GI ROS, Neg liver ROS,   Endo/Other  negative endocrine ROS  Renal/GU negative Renal ROS  negative genitourinary   Musculoskeletal   Abdominal   Peds  Hematology  (+) Blood dyscrasia, anemia ,   Anesthesia Other Findings Past Medical History: No date: Anemia     Comment:  H/O No date: Anxiety No date: Arthritis     Comment:  SPINE No date: Complication of anesthesia     Comment:  PT STATES DURING HER BACK SURGERY, IT ENDED UP BEING A               LOT LONGER SURGERY THAN ANTIICIPATED AND COING OUT OF               ANESTHESIA SHE BEGAN JERKING VIOLENTLY (UNSURE IF SHE HAD              A SEIZURE OR NOT) Past Surgical History: 2002: BACK SURGERY     Comment:  Lumbar No date: BREAST BIOPSY; Right     Comment:  neg 2017: COLONOSCOPY 12/02/2015: DILATATION & CURETTAGE/HYSTEROSCOPY WITH MYOSURE; N/A     Comment:  Procedure: DILATATION & CURETTAGE/HYSTEROSCOPY WITH               MYOSURE;  Surgeon: [name], MD;  Location: [facility]               ORS;  Service: Gynecology;  Laterality: N/A; BMI    Body Mass Index: 24.58 kg/m     Reproductive/Obstetrics negative OB ROS                              Anesthesia Physical Anesthesia Plan  ASA: II  Anesthesia Plan: General ETT   Post-op Pain Management:    Induction:   PONV Risk Score and Plan: 3  Airway Management Planned:   Additional Equipment:   Intra-op Plan:   Post-operative Plan:   Informed Consent: I have reviewed the patients History and Physical, chart, labs and discussed the procedure including the risks, benefits and alternatives for the proposed anesthesia with the patient or authorized representative who has indicated his/her understanding and acceptance.     Dental Advisory Given  Plan Discussed with: CRNA  Anesthesia Plan Comments:         Anesthesia Quick Evaluation

## 2019-03-04 NOTE — Transfer of Care (Signed)
Immediate Anesthesia Transfer of Care Note  Patient: Christina Gibbs  Procedure(s) Performed: DILATATION AND CURETTAGE /HYSTEROSCOPY AND POLYPECTOMY (N/A ) LAPAROSCOPY OPERATIVE (N/A ) LAPAROSCOPIC SALPINGO OOPHORECTOMY, (Left )  Patient Location: PACU  Anesthesia Type:General  Level of Consciousness: sedated  Airway & Oxygen Therapy: Patient Spontanous Breathing and Patient connected to nasal cannula oxygen  Post-op Assessment: Report given to RN and Post -op Vital signs reviewed and stable  Post vital signs: Reviewed and stable  Last Vitals:  Vitals Value Taken Time  BP 120/81 03/04/19 1037  Temp 37.2 C 03/04/19 1037  Pulse 72 03/04/19 1051  Resp 12 03/04/19 1051  SpO2 99 % 03/04/19 1051  Vitals shown include unvalidated device data.  Last Pain:  Vitals:   03/04/19 1037  TempSrc:   PainSc: 0-No pain         Complications: No apparent anesthesia complications

## 2019-03-04 NOTE — Discharge Instructions (Signed)
Laparoscopic Ovarian Surgery Discharge Instructions  For the next three days, take ibuprofen and acetaminophen on a schedule, every 8 hours. You can take them together or you can intersperse them, and take one every four hours. I also gave you gabapentin for nighttime, to help you sleep and also to control pain. Take gabapentin medicines at night for at least the next 3 nights. You also have a narcotic, oxycodone, to take as needed if the above medicines don't help.  Postop constipation is a major cause of pain. Stay well hydrated, walk as you tolerate, and take over the counter senna as well as stool softeners if you need them.  All of the meds I have given you can be taken as needed.  RISKS AND COMPLICATIONS   Infection.  Bleeding.  Injury to surrounding organs.  Anesthetic side effects.   PROCEDURE   You may be given a medicine to help you relax (sedative) before the procedure. You will be given a medicine to make you sleep (general anesthetic) during the procedure.  A tube will be put down your throat to help your breath while under general anesthesia.  Several small cuts (incisions) are made in the lower abdominal area and one incision is made near the belly button.  Your abdominal area will be inflated with a safe gas (carbon dioxide). This helps give the surgeon room to operate, visualize, and helps the surgeon avoid other organs.  A thin, lighted tube (laparoscope) with a camera attached is inserted into your abdomen through the incision near the belly button. Other small instruments may also be inserted through other abdominal incisions.  The ovary is located and are removed.  After the ovary is removed, the gas is released from the abdomen.  The incisions will be closed with stitches (sutures), and Dermabond. A bandage may be placed over the incisions.  AFTER THE PROCEDURE   You will also have some mild abdominal discomfort for 3-7 days. You will be given pain  medicine to ease any discomfort.  As long as there are no problems, you may be allowed to go home. Someone will need to drive you home and be with you for at least 24 hours once home.  You may have some mild discomfort in the throat. This is from the tube placed in your throat while you were sleeping.  You may experience discomfort in the shoulder area from some trapped air between the liver and diaphragm. This sensation is normal and will slowly go away on its own.  HOME CARE INSTRUCTIONS   Take all medicines as directed.  Only take over-the-counter or prescription medicines for pain, discomfort, or fever as directed by your caregiver.  Resume daily activities as directed.  Showers are preferred over baths for 2 weeks.  You may resume sexual activities in 1 week or as you feel you would like to.  Do not drive while taking narcotics.  SEEK MEDICAL CARE IF: .  There is increasing abdominal pain.  You feel lightheaded or faint.  You have the chills.  You have an oral temperature above 102 F (38.9 C).  There is pus-like (purulent) drainage from any of the wounds.  You are unable to pass gas or have a bowel movement.  You feel sick to your stomach (nauseous) or throw up (vomit) and can't control it with your medicines.  MAKE SURE YOU:   Understand these instructions.  Will watch your condition.  Will get help right away if you are not doing  well or get worse.  ExitCare Patient Information 2013 [place].  AMBULATORY SURGERY  DISCHARGE INSTRUCTIONS   1) The drugs that you were given will stay in your system until tomorrow so for the next 24 hours you should not:  A) Drive an automobile B) Make any legal decisions C) Drink any alcoholic beverage   2) You may resume regular meals tomorrow.  Today it is better to start with liquids and gradually work up to solid foods.  You may eat anything you prefer, but it is better to start with liquids, then soup and  crackers, and gradually work up to solid foods.   3) Please notify your doctor immediately if you have any unusual bleeding, trouble breathing, redness and pain at the surgery site, drainage, fever, or pain not relieved by medication.    4) Additional Instructions:        Please contact your physician with any problems or Same Day Surgery at [phone], Monday through Friday 6 am to 4 pm, or [place] at [facility] number at [phone].

## 2019-03-04 NOTE — Op Note (Signed)
Benjamine Sprague PROCEDURE DATE: 03/04/2019  PREOPERATIVE DIAGNOSIS: Chronic pelvic pain, ovarian cyst, thickened endometrial stripe, heavy uterine bleeding  POSTOPERATIVE DIAGNOSIS:  - Left ovarian cyst - Sigmoid bowel adhesions - Endometrial polyp, small and posterior  PROCEDURE:  - Operative laparoscopy - Lysis of adhesions along left sigmoid bowel and right lower quadrant - Left salpingo-ophorectomy  - Fractional dilation and curettage - Endometrial polypectomy with targeted Myosure  SURGEON:  Dr. [name] ASSISTANT: Dr. Leeroy Bock Ward  ANESTHESIOLOGIST: Lenard Simmer, MD Anesthesiologist: Yevette Edwards, MD; Lenard Simmer, MD CRNA: Almeta Monas, CRNA  INDICATIONS: 49 y.o. F with history of chronic left sided pelvic pain, abnormal uterine bleeding and a left ovarian cyst desiring surgical evaluation.     Please see preoperative notes for further details.  Risks of surgery were discussed with the patient including but not limited to: bleeding which may require transfusion or reoperation; infection which may require antibiotics; injury to bowel, bladder, ureters or other surrounding organs; need for additional procedures including laparotomy; thromboembolic phenomenon, incisional problems and other postoperative/anesthesia complications. Written informed consent was obtained.    FINDINGS:  Small uterus, normal ovaries with left ovarian cyst, and normal fallopian tubes bilaterally.   Left sigmoid adhesions which were taken down Large ovarian vessels bilaterally that looked enlarged. Normal appendix. No other abdominal/pelvic abnormality.  Normal upper abdomen.  ANESTHESIA:    General INTRAVENOUS FLUIDS: 700 ml ESTIMATED BLOOD LOSS: 25 ml URINE OUTPUT: 300 ml SPECIMENS: Left ovary and tube, endometrial curettings, endocervical curettings, endometrial polyp COMPLICATIONS: None immediate  PROCEDURE IN DETAIL:  The patient had sequential compression devices applied to  her lower extremities while in the preoperative area.  She was then taken to the operating room where general anesthesia was administered and was found to be adequate.  She was placed in the dorsal lithotomy position, and was prepped and draped in a sterile manner.  2g of ancef given.  A Foley catheter was inserted into her bladder and attached to constant drainage and a sponge stick was placed in the vagina.  After an adequate timeout was performed, attention was turned to the abdomen where an umbilical incision was made with the scalpel.  The Optiview 5-mm trocar and sleeve were then advanced without difficulty with the laparoscope under direct visualization into the abdomen.  The abdomen was then insufflated with carbon dioxide gas and adequate pneumoperitoneum was obtained.   A detailed survey of the patient's pelvis and abdomen revealed the findings as mentioned above.  An additional 67mm trocar was placed in the bilateral lower quadrants under direct visualization.  Evaluation of the pelvic sidewalls to observe the course of the ureters was undertaken, assuring the ureter was outside the operative field.   Left sigmoid pelvic adhesions were taken down with the LigaSure device, and the right lower quadrant adhesions were taken down similarly.  No bleeding or damage to the bowel was noted during this procedure.  Left oopherectomy The left adnexa was elevated away from the pelvic sidewall. Using a Ligasure electrocautery device, the IP ligament was skeletonized, triply clamped, cauterized and transected to remove the ovary and fallopian tube in its entirety, being careful not to spill the contents of the ovarian cyst.  The operative site was surveyed, and it was found to be hemostatic.  No intraoperative injury to surrounding organs was noted.  Bilateral ureters were noted to be pulsatile.  A 10 Endo Catch bag was placed into the posterior cul-de-sac through the vagina.  A 1 cm incision  above the  bag introducer was made using monopolar scissors, and the bag was advanced into the pelvis.  The left ovary and portion of the fallopian tube that was attached was placed in the bag, and the full specimen was removed through the posterior vagina.  It was closed for hemostasis with 2 figure-of-eight stitches from below.  This procedure was observed from above, assuring both hemostasis and appropriate closure.  Pictures were taken of the quadrants and pelvis. The abdomen was desufflated and all instruments were then removed from the patient's abdomen. The uterine manipulator was removed without complications.  All incisions were closed with 4-0 Vicryl and Dermabond.   Attention was turned to the vagina.  Her cervix was serially dilated to 15 [place] using Hanks dilators.  An ECC was performed. The hysteroscope was introduced under direct observation. Using lactated ringers as a distention medium, note was made of the above findings. The uterine cavity was carefully examined, both ostia were recognized, and diffusely proliferative endometrium with the polyp above were noted.   This was resected using the Myosure device.  After further careful visualization of the uterine cavity, the hysteroscope was removed under direct visualization.  A sharp curettage was then performed until there was a gritty texture in all four quadrants.  The tenaculum was removed from the anterior lip of the cervix and the vaginal speculum was removed after assuring good hemostasis from both the posterior vagina and the tenaculum sites.   The patient tolerated the procedure well and was taken to the recovery area awake and in stable condition. She received iv acetaminophen and Toradol prior to leaving the OR.  The patient will be discharged to home as per PACU criteria. Routine postoperative instructions given.  She was prescribed Ibuprofen and Colace.  She will follow up in the clinic in two weeks for postoperative evaluation.

## 2019-03-04 NOTE — Anesthesia Procedure Notes (Signed)
Procedure Name: Intubation Date/Time: 03/04/2019 9:01 AM Performed by: Almeta Monas, CRNA Pre-anesthesia Checklist: Patient identified, Patient being monitored, Timeout performed, Emergency Drugs available and Suction available Patient Re-evaluated:Patient Re-evaluated prior to induction Oxygen Delivery Method: Circle system utilized Preoxygenation: Pre-oxygenation with 100% oxygen Induction Type: IV induction Ventilation: Mask ventilation without difficulty Laryngoscope Size: 3 and McGraph Grade View: Grade I Tube type: Oral Tube size: 7.0 mm Number of attempts: 1 Airway Equipment and Method: Stylet and Video-laryngoscopy Placement Confirmation: ETT inserted through vocal cords under direct vision,  positive ETCO2 and breath sounds checked- equal and bilateral Secured at: 21 cm Tube secured with: Tape Dental Injury: Teeth and Oropharynx as per pre-operative assessment

## 2019-03-04 NOTE — Interval H&P Note (Signed)
History and Physical Interval Note:  03/04/2019 8:32 AM  Christina Gibbs  has presented today for surgery, with the diagnosis of AUB, thickened endometrial stripe, chronic pelvic pain.  The various methods of treatment have been discussed with the patient and family. After consideration of risks, benefits and other options for treatment, the patient has consented to  Procedure(s): DILATATION AND CURETTAGE /HYSTEROSCOPY (N/A) LAPAROSCOPY DIAGNOSTIC (N/A) LAPAROSCOPIC OOPHORECTOMY, possible polypectomy (Left) as a surgical intervention.  [name] history has been reviewed, patient examined, no change in status, stable for surgery.  I have reviewed the patient's chart and labs.  Questions were answered to the patient's satisfaction.     [name]

## 2019-03-05 LAB — SURGICAL PATHOLOGY

## 2019-03-06 NOTE — Anesthesia Postprocedure Evaluation (Signed)
Anesthesia Post Note  Patient: Christina Gibbs  Procedure(s) Performed: DILATATION AND CURETTAGE /HYSTEROSCOPY AND POLYPECTOMY (N/A ) LAPAROSCOPY OPERATIVE (N/A ) LAPAROSCOPIC SALPINGO OOPHORECTOMY, (Left )  Patient location during evaluation: PACU Anesthesia Type: General Level of consciousness: awake and alert Pain management: pain level controlled Vital Signs Assessment: post-procedure vital signs reviewed and stable Respiratory status: spontaneous breathing, nonlabored ventilation, respiratory function stable and patient connected to nasal cannula oxygen Cardiovascular status: blood pressure returned to baseline and stable Postop Assessment: no apparent nausea or vomiting Anesthetic complications: no     Last Vitals:  Vitals:   03/04/19 1136 03/04/19 1204  BP: 109/60 (P) 106/68  Pulse: 81 (P) 68  Resp: 16 (P) 12  Temp: 36.9 C (P) 36.6 C  SpO2: 100% (P) 98%    Last Pain:  Vitals:   03/04/19 1136  TempSrc: Oral  PainSc: 2                  Lenard Simmer

## 2019-03-16 ENCOUNTER — Other Ambulatory Visit: Payer: 59

## 2019-03-19 ENCOUNTER — Other Ambulatory Visit: Payer: 59

## 2019-05-23 ENCOUNTER — Ambulatory Visit: Payer: 59 | Attending: Internal Medicine

## 2019-05-23 DIAGNOSIS — Z23 Encounter for immunization: Secondary | ICD-10-CM

## 2019-05-23 NOTE — Progress Notes (Signed)
   Covid-19 Vaccination Clinic  Name:  LENNIS RADER    MRN: [id number] DOB: [birth year]  05/23/2019  Ms. Chaplin was observed post Covid-19 immunization for 30 minutes based on pre-vaccination screening without incident. She was provided with Vaccine Information Sheet and instruction to access the V-Safe system.   Ms. Goren was instructed to call 911 with any severe reactions post vaccine: Marland Kitchen Difficulty breathing  . Swelling of face and throat  . A fast heartbeat  . A bad rash all over body  . Dizziness and weakness   Immunizations Administered    Name Date Dose VIS Date Route   Pfizer COVID-19 Vaccine 05/23/2019  1:44 PM 0.3 mL 01/23/2019 Intramuscular   Manufacturer: [employer], [employer]   Lot: [id number]   NDC: [address]-2

## 2019-06-17 ENCOUNTER — Other Ambulatory Visit: Payer: Self-pay | Admitting: Unknown Physician Specialty

## 2019-06-17 ENCOUNTER — Ambulatory Visit: Payer: 59 | Attending: Internal Medicine

## 2019-06-17 DIAGNOSIS — Z9889 Other specified postprocedural states: Secondary | ICD-10-CM

## 2019-06-17 DIAGNOSIS — M5416 Radiculopathy, lumbar region: Secondary | ICD-10-CM

## 2019-06-17 DIAGNOSIS — Z23 Encounter for immunization: Secondary | ICD-10-CM

## 2019-06-17 NOTE — Progress Notes (Signed)
   Covid-19 Vaccination Clinic  Name:  Christina Gibbs    MRN: [id number] DOB: Apr 12, 1970  06/17/2019  Christina Gibbs was observed post Covid-19 immunization for 15 minutes without incident. She was provided with Vaccine Information Sheet and instruction to access the V-Safe system.   Christina Gibbs was instructed to call 911 with any severe reactions post vaccine: Marland Kitchen Difficulty breathing  . Swelling of face and throat  . A fast heartbeat  . A bad rash all over body  . Dizziness and weakness   Immunizations Administered    Name Date Dose VIS Date Route   Pfizer COVID-19 Vaccine 06/17/2019  3:17 PM 0.3 mL 04/08/2018 Intramuscular   Manufacturer: [employer], [employer]   Lot: [id number]   NDC: [address]-4

## 2021-06-12 ENCOUNTER — Ambulatory Visit: Payer: 59

## 2021-06-12 ENCOUNTER — Other Ambulatory Visit: Payer: Self-pay | Admitting: Obstetrics and Gynecology

## 2021-06-12 DIAGNOSIS — Z1231 Encounter for screening mammogram for malignant neoplasm of breast: Secondary | ICD-10-CM

## 2021-06-19 ENCOUNTER — Ambulatory Visit
Admission: RE | Admit: 2021-06-19 | Discharge: 2021-06-19 | Disposition: A | Discharge status: Home / Self Care | Payer: BC Managed Care – PPO | Source: Ambulatory Visit | Attending: Obstetrics and Gynecology | Admitting: Obstetrics and Gynecology

## 2021-06-19 DIAGNOSIS — Z1231 Encounter for screening mammogram for malignant neoplasm of breast: Secondary | ICD-10-CM | POA: Diagnosis present

## 2022-09-26 IMAGING — MG MM DIGITAL SCREENING BILAT W/ TOMO AND CAD
8 series · 8 of 24 positions shown · non-contrast
Comparison: Previous exam(s).

CLINICAL DATA: Screening.

EXAM:
DIGITAL SCREENING BILATERAL MAMMOGRAM WITH TOMOSYNTHESIS AND CAD
TECHNIQUE: Bilateral screening digital craniocaudal and mediolateral oblique
mammograms were obtained. Bilateral screening digital breast
tomosynthesis was performed. The images were evaluated with
computer-aided detection.

[R MLO synth-2D]
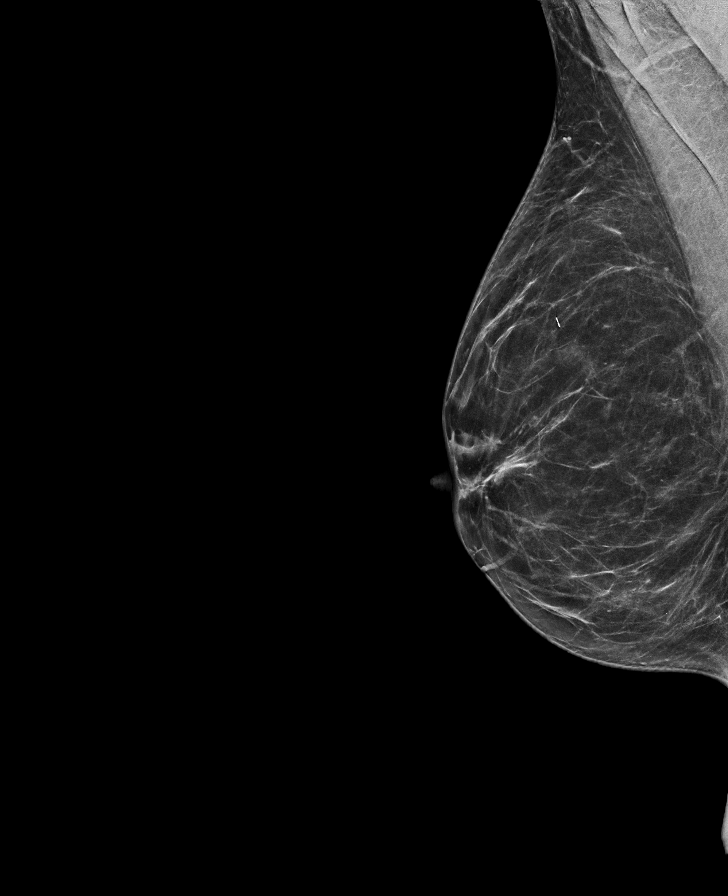

[R CC synth-2D]
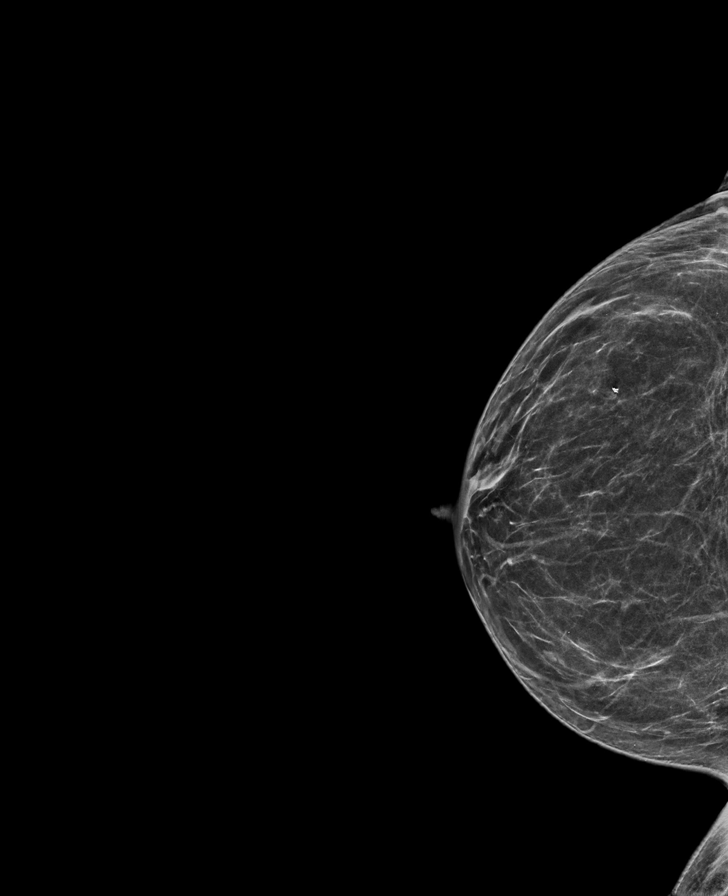

[L CC synth-2D]
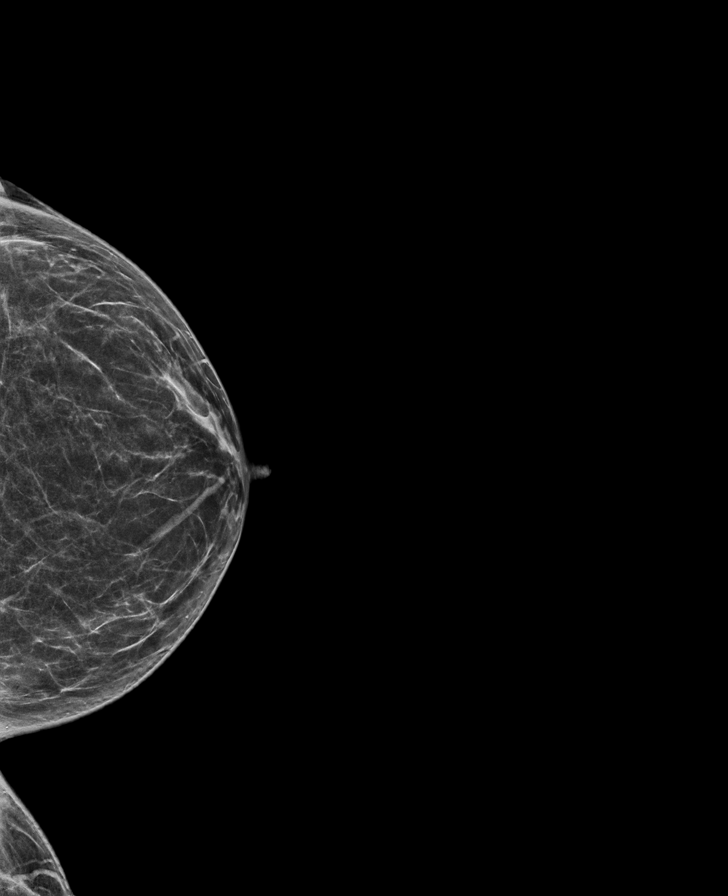

[L MLO synth-2D]
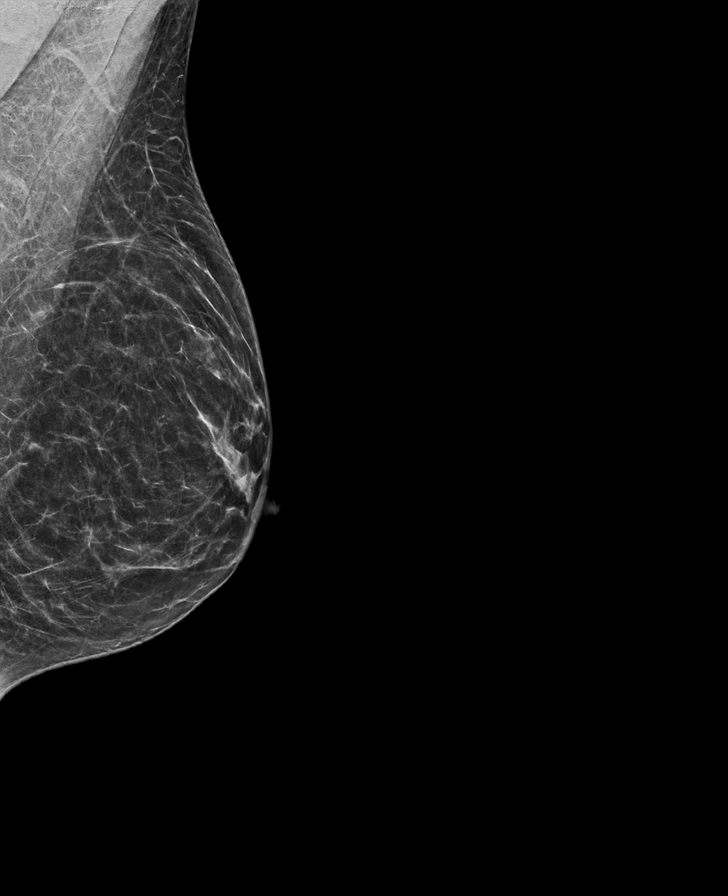

[L CC tomo · tomo slice 29/58.0]
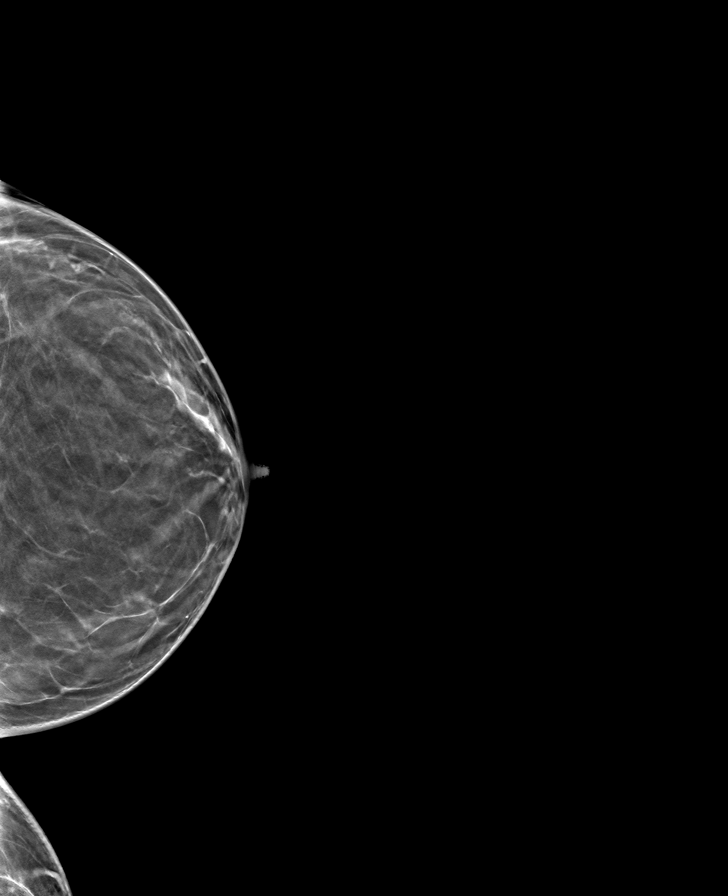

[R CC tomo · tomo slice 34/67.0]
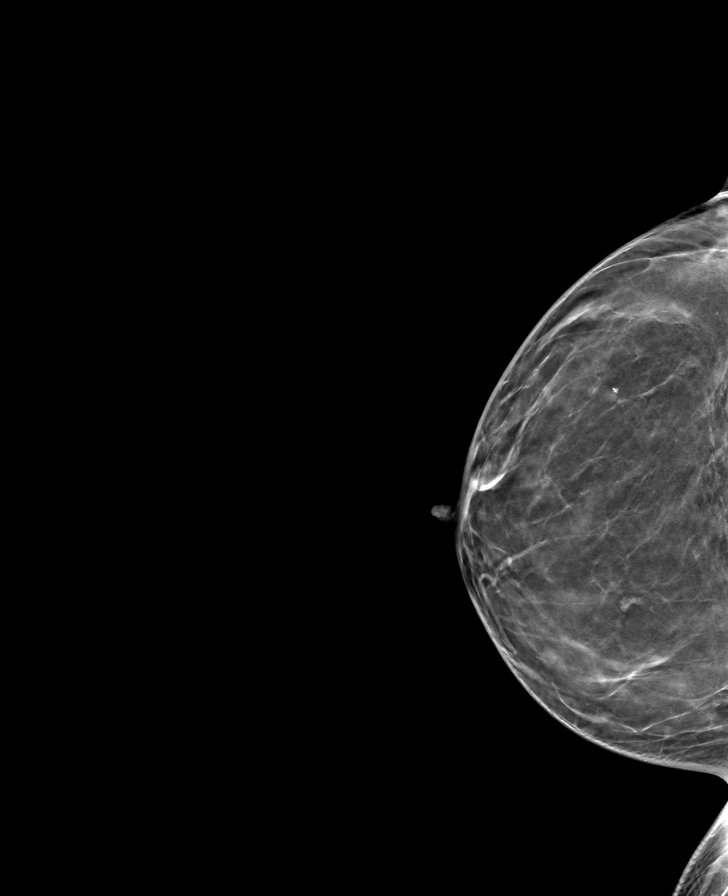

[R MLO tomo · tomo slice 35/68.0]
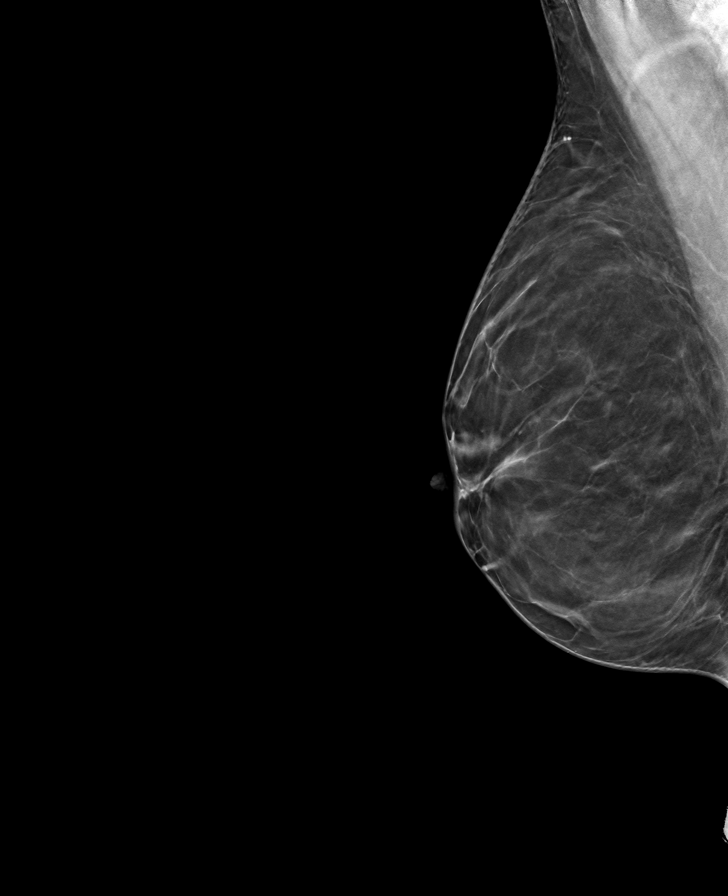

[L MLO tomo · tomo slice 32/63.0]
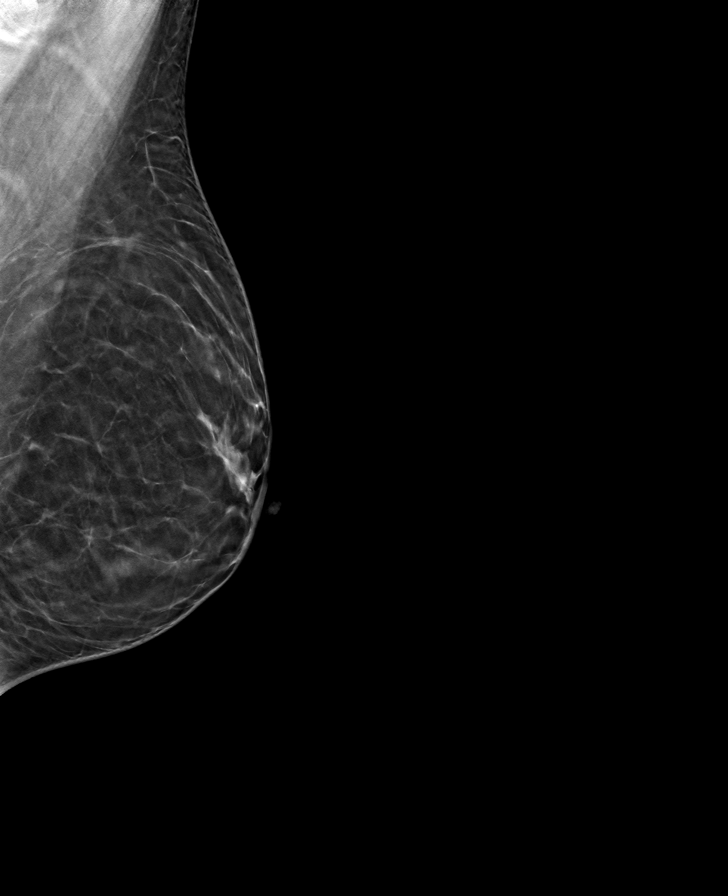

[8 of 24 positions shown; findings below may reference images not displayed]

ACR Breast Density Category b: There are scattered areas of
fibroglandular density.
FINDINGS: There are no findings suspicious for malignancy.
IMPRESSION: No mammographic evidence of malignancy. A result letter of this
screening mammogram will be mailed directly to the patient.

RECOMMENDATION:
Screening mammogram in one year. (Code:51-O-LD2)

BI-RADS CATEGORY  1: Negative.
